# Patient Record
Sex: Female | Born: 1995 | Race: Black or African American | Hispanic: No | Marital: Single | Smoking: Never smoker
Health system: Southern US, Community
[De-identification: ages and names within clinical notes are randomized; demographics above are authoritative.]

## PROBLEM LIST (undated history)

## (undated) DIAGNOSIS — S82892A Other fracture of left lower leg, initial encounter for closed fracture: Secondary | ICD-10-CM

---
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
## 2017-10-25 ENCOUNTER — Other Ambulatory Visit: Payer: Self-pay

## 2017-10-25 ENCOUNTER — Ambulatory Visit (HOSPITAL_COMMUNITY)
Admission: EM | Admit: 2017-10-25 | Discharge: 2017-10-25 | Disposition: A | Discharge status: Home / Self Care | Payer: Medicaid Other | Attending: Family Medicine | Admitting: Family Medicine

## 2017-10-25 ENCOUNTER — Encounter (HOSPITAL_COMMUNITY): Payer: Self-pay

## 2017-10-25 DIAGNOSIS — B9789 Other viral agents as the cause of diseases classified elsewhere: Secondary | ICD-10-CM

## 2017-10-25 DIAGNOSIS — J069 Acute upper respiratory infection, unspecified: Secondary | ICD-10-CM | POA: Diagnosis not present

## 2017-10-25 MED ORDER — METHYLPREDNISOLONE SODIUM SUCC 125 MG IJ SOLR: 80.0000 mg | Freq: Once | INTRAMUSCULAR | Status: DC

## 2017-10-25 MED ORDER — DM-GUAIFENESIN ER 30-600 MG PO TB12: 1.0000 | ORAL_TABLET | Freq: Two times a day (BID) | ORAL | 0 refills | Status: DC

## 2017-10-25 MED ORDER — ALBUTEROL SULFATE HFA 108 (90 BASE) MCG/ACT IN AERS: 1.0000 | INHALATION_SPRAY | Freq: Four times a day (QID) | RESPIRATORY_TRACT | 0 refills | Status: AC | PRN

## 2017-10-25 MED ORDER — METHYLPREDNISOLONE SODIUM SUCC 125 MG IJ SOLR
INTRAMUSCULAR | Status: AC
  Filled 2017-10-25: qty 2

## 2017-10-25 NOTE — ED Provider Notes (Signed)
[facility]    CSN: [id number] Arrival date & time: 10/25/17  1509     History   Chief Complaint Chief Complaint  Patient presents with  . Cough    HPI Jennifer Santiago is a 22 y.o. female.   Patient is a healthy 22 year old female that presents with URI symptoms x4 days.  She has had cough, congestion with mucous.  She has had mild sore throat.  The symptoms have been constant and remain the same.  The cough becomes worse at night.  She has not taken anything for her symptoms.  She denies any ear pain, fever, chills, body aches, night sweats. No recent travels or sick contacts.   She does not smoke. No hx of asthma.   ROS per HPI       Past Medical History:  Diagnosis Date  . Ankle fracture, left 2013    There are no active problems to display for this patient.   History reviewed. No pertinent surgical history.  OB History   None      Home Medications    Prior to Admission medications   Medication Sig Start Date End Date Taking? Authorizing Provider  albuterol (PROVENTIL HFA;VENTOLIN HFA) 108 (90 Base) MCG/ACT inhaler Inhale 1-2 puffs into the lungs every 6 (six) hours as needed for wheezing or shortness of breath. 10/25/17   Quinton Voth A, NP  dextromethorphan-guaiFENesin (MUCINEX DM) 30-600 MG 12hr tablet Take 1 tablet by mouth 2 (two) times daily. 10/25/17   Dahlia ByesBast, Jasiyah Poland A, NP  phenazopyridine (PYRIDIUM) 200 MG tablet Take 1 tablet (200 mg total) by mouth 3 (three) times daily. Patient not taking: Reported on 10/25/2017 01/13/16   Hayden RasmussenMabe, David, NP    Family History History reviewed. No pertinent family history.  Social History Social History   Tobacco Use  . Smoking status: Never Smoker  . Smokeless tobacco: Never Used  Substance Use Topics  . Alcohol use: No  . Drug use: No     Allergies   Patient has no known allergies.   Review of Systems Review of Systems   Physical Exam Triage Vital Signs ED Triage Vitals  Enc Vitals Group       BP 10/25/17 1527 139/82     Pulse Rate 10/25/17 1527 (!) 104     Resp 10/25/17 1527 16     Temp 10/25/17 1527 98.1 F (36.7 C)     Temp Source 10/25/17 1527 Oral     SpO2 10/25/17 1527 100 %     Weight 10/25/17 1529 160 lb (72.6 kg)     Height --      Head Circumference --      Peak Flow --      Pain Score 10/25/17 1529 6     Pain Loc --      Pain Edu? --      Excl. in GC? --    No data found.  Updated Vital Signs BP 139/82   Pulse (!) 104   Temp 98.1 F (36.7 C) (Oral)   Resp 16   Wt 160 lb (72.6 kg)   LMP 10/25/2017   SpO2 100%   BMI 26.63 kg/m   Visual Acuity Right Eye Distance:   Left Eye Distance:   Bilateral Distance:    Right Eye Near:   Left Eye Near:    Bilateral Near:     Physical Exam  Constitutional: She appears well-developed and well-nourished.  HENT:  Head: Normocephalic and atraumatic.  Eyes:  Pupils are equal, round, and reactive to light.  Neck: Normal range of motion.  Cardiovascular: Normal rate.  Mildly tachycardic  Pulmonary/Chest: Effort normal.  Lungs clear.  Coarse cough.  No wheezing, rhonchi.   Musculoskeletal: Normal range of motion.  Neurological: She is alert.  Skin: Skin is warm and dry.  Psychiatric: She has a normal mood and affect.  Nursing note and vitals reviewed.    UC Treatments / Results  Labs (all labs ordered are listed, but only abnormal results are displayed) Labs Reviewed - No data to display  EKG None  Radiology No results found.  Procedures Procedures (including critical care time)  Medications Ordered in UC Medications - No data to display  Initial Impression / Assessment and Plan / UC Course  I have reviewed the triage vital signs and the nursing notes.  Pertinent labs & imaging results that were available during my care of the patient were reviewed by me and considered in my medical decision making (see chart for details).     Viral URI- will treat symptomatically Follow up as needed  for continued or worsening symptoms  Final Clinical Impressions(s) / UC Diagnoses   Final diagnoses:  Viral URI with cough     Discharge Instructions      It was nice meeting you!!  I believe you have a viral upper respiratory infection. It could take 7 to 10 days for the symptoms to decrease or improve.  We will treat your cough and congestion with mucinex DM  Albuterol inhaler as needed for wheezing, or SOB.  Ibuprofen and tylenol can help with the pain.  Chloraseptic throat spray or lozenges are another option for symptoms relief.  Warm tea may help.  Follow up as needed for worsening symptoms.     ED Prescriptions    Medication Sig Dispense Auth. Provider   dextromethorphan-guaiFENesin (MUCINEX DM) 30-600 MG 12hr tablet Take 1 tablet by mouth 2 (two) times daily. 30 tablet Hend Mccarrell A, NP   albuterol (PROVENTIL HFA;VENTOLIN HFA) 108 (90 Base) MCG/ACT inhaler Inhale 1-2 puffs into the lungs every 6 (six) hours as needed for wheezing or shortness of breath. 1 Inhaler Dahlia ByesBast, Jaimarie Rapozo A, NP     Controlled Substance Prescriptions [place] Controlled Substance Registry consulted? Not Applicable   Janace ArisBast, Leyan Branden A, NP 10/25/17 1626

## 2017-10-25 NOTE — Discharge Instructions (Addendum)
°  It was nice meeting you!!  I believe you have a viral upper respiratory infection. It could take 7 to 10 days for the symptoms to decrease or improve.  We will treat your cough and congestion with mucinex DM  Albuterol inhaler as needed for wheezing, or SOB.  Ibuprofen and tylenol can help with the pain.  Chloraseptic throat spray or lozenges are another option for symptoms relief.  Warm tea may help.  Follow up as needed for worsening symptoms.

## 2017-10-25 NOTE — ED Triage Notes (Signed)
Cough and abdominal discomfort x 4days

## 2017-11-21 ENCOUNTER — Encounter (HOSPITAL_COMMUNITY): Payer: Self-pay

## 2017-11-21 ENCOUNTER — Other Ambulatory Visit: Payer: Self-pay

## 2017-11-21 ENCOUNTER — Emergency Department (HOSPITAL_COMMUNITY)
Admission: EM | Admit: 2017-11-21 | Discharge: 2017-11-21 | Disposition: A | Discharge status: Home / Self Care | Payer: Medicaid Other | Attending: Emergency Medicine | Admitting: Emergency Medicine

## 2017-11-21 DIAGNOSIS — R600 Localized edema: Secondary | ICD-10-CM

## 2017-11-21 MED ORDER — PREDNISONE 10 MG PO TABS: 20.0000 mg | ORAL_TABLET | Freq: Every day | ORAL | 0 refills | Status: DC

## 2017-11-21 NOTE — ED Provider Notes (Signed)
[facility]-EMERGENCY DEPT Provider Note   CSN: [id number] Arrival date & time: 11/21/17  1036     History   Chief Complaint Chief Complaint  Patient presents with  . Oral Swelling    HPI Jennifer Santiago is a 22 y.o. female.  22 year old female with prior medical history as documented below presents with complaint of swelling of the upper lip.  Patient reports that she woke up this morning and noticed mild edema of her upper lip.  She denies any other edema or complaint.  She denies difficulty swallowing.  She denies pain to the lip.  She denies swelling of the tongue.  She denies difficulty with phonation.  She took Benadryl at home prior to arrival and her swollen lip has improved significantly.  She denies prior occurrence of similar complaints.  The history is provided by the patient.  Illness  This is a new problem. The current episode started 1 to 2 hours ago. The problem occurs constantly. The problem has been resolved. Pertinent negatives include no chest pain, no abdominal pain, no headaches and no shortness of breath. Nothing aggravates the symptoms. Nothing relieves the symptoms. She has tried nothing for the symptoms.    Past Medical History:  Diagnosis Date  . Ankle fracture, left 2013    There are no active problems to display for this patient.   History reviewed. No pertinent surgical history.   OB History   None      Home Medications    Prior to Admission medications   Medication Sig Start Date End Date Taking? Authorizing Provider  albuterol (PROVENTIL HFA;VENTOLIN HFA) 108 (90 Base) MCG/ACT inhaler Inhale 1-2 puffs into the lungs every 6 (six) hours as needed for wheezing or shortness of breath. 10/25/17  Yes Bast, Traci A, NP  dextromethorphan-guaiFENesin (MUCINEX DM) 30-600 MG 12hr tablet Take 1 tablet by mouth 2 (two) times daily. Patient not taking: Reported on 11/21/2017 10/25/17   Dahlia Byes A, NP  phenazopyridine (PYRIDIUM)  200 MG tablet Take 1 tablet (200 mg total) by mouth 3 (three) times daily. Patient not taking: Reported on 10/25/2017 01/13/16   Hayden Rasmussen, NP  predniSONE (DELTASONE) 10 MG tablet Take 2 tablets (20 mg total) by mouth daily. 11/21/17   Wynetta Fines, MD    Family History Family History  Problem Relation Age of Onset  . Diabetes Mother     Social History Social History   Tobacco Use  . Smoking status: Never Smoker  . Smokeless tobacco: Never Used  Substance Use Topics  . Alcohol use: No  . Drug use: No     Allergies   Patient has no known allergies.   Review of Systems Review of Systems  Respiratory: Negative for shortness of breath.   Cardiovascular: Negative for chest pain.  Gastrointestinal: Negative for abdominal pain.  Neurological: Negative for headaches.  All other systems reviewed and are negative.    Physical Exam Updated Vital Signs BP 140/81 (BP Location: Right Arm)   Pulse (!) 125   Temp 99.2 F (37.3 C) (Oral)   Resp 18   Ht 5\' 6"  (1.676 m)   Wt 68 kg   LMP 10/25/2017   SpO2 97%   BMI 24.21 kg/m   Physical Exam  Constitutional: She is oriented to person, place, and time. She appears well-developed and well-nourished. No distress.  HENT:  Head: Normocephalic and atraumatic.  Mouth/Throat: Oropharynx is clear and moist.  Mild edema noted of upper lip -  no erythema - no other oral edema noted   Eyes: Pupils are equal, round, and reactive to light. Conjunctivae and EOM are normal.  Neck: Normal range of motion. Neck supple.  Cardiovascular: Normal rate, regular rhythm and normal heart sounds.  Pulmonary/Chest: Effort normal and breath sounds normal. No respiratory distress.  Abdominal: Soft. She exhibits no distension. There is no tenderness.  Musculoskeletal: Normal range of motion. She exhibits no edema or deformity.  Neurological: She is alert and oriented to person, place, and time.  Skin: Skin is warm and dry.  No rash   Psychiatric:  She has a normal mood and affect.  Nursing note and vitals reviewed.    ED Treatments / Results  Labs (all labs ordered are listed, but only abnormal results are displayed) Labs Reviewed - No data to display  EKG None  Radiology No results found.  Procedures Procedures (including critical care time)  Medications Ordered in ED Medications - No data to display   Initial Impression / Assessment and Plan / ED Course  I have reviewed the triage vital signs and the nursing notes.  Pertinent labs & imaging results that were available during my care of the patient were reviewed by me and considered in my medical decision making (see chart for details).     MDM  Screen complete  Patient is presenting for evaluation of upper lip edema.  This edema has significantly improved prior to arrival in the ED after she took Benadryl at home.  I suspect that the patient may have at least a minimal allergic reaction.  Given that symptoms have significantly improved I do not feel that she would necessarily requires prednisone at this time.  Patient educated about the treatment for a more significant allergic reaction.  Patient will be given a prescription for prednisone in case her swelling recurs or returns.  Strict return precautions given and understood.  Importance of close follow-up was stressed.   Final Clinical Impressions(s) / ED Diagnoses   Final diagnoses:  Lip edema    ED Discharge Orders         Ordered    predniSONE (DELTASONE) 10 MG tablet  Daily     11/21/17 1219           Wynetta FinesMessick, Dunya Meiners C, MD 11/21/17 1223

## 2017-11-21 NOTE — ED Notes (Signed)
ED Provider at bedside. 

## 2017-11-21 NOTE — Discharge Instructions (Signed)
Please return for any problem. Follow up with a regular physician as instructed.

## 2017-11-21 NOTE — ED Notes (Signed)
Bed: [facility]ALB Expected date:  Expected time:  Means of arrival:  Comments: 

## 2017-11-21 NOTE — ED Triage Notes (Signed)
Patient reports that she began having upper lip swelling since 0830 this AM. patient states she had a [place] cheese steak prior to lip swelling. Patient denies any problems swallowing. patient eating Fritos in triage.

## 2017-11-21 NOTE — ED Notes (Signed)
Bed: [id number] Expected date:  Expected time:  Means of arrival:  Comments: 22 yo m, combative

## 2018-04-17 ENCOUNTER — Emergency Department (HOSPITAL_COMMUNITY)
Admission: EM | Admit: 2018-04-17 | Discharge: 2018-04-17 | Disposition: A | Discharge status: Home / Self Care | Payer: Medicaid Other | Attending: Emergency Medicine | Admitting: Emergency Medicine

## 2018-04-17 ENCOUNTER — Encounter (HOSPITAL_COMMUNITY): Payer: Self-pay

## 2018-04-17 DIAGNOSIS — R0789 Other chest pain: Secondary | ICD-10-CM | POA: Insufficient documentation

## 2018-04-17 DIAGNOSIS — M7918 Myalgia, other site: Secondary | ICD-10-CM

## 2018-04-17 DIAGNOSIS — Z79899 Other long term (current) drug therapy: Secondary | ICD-10-CM | POA: Diagnosis not present

## 2018-04-17 DIAGNOSIS — Y999 Unspecified external cause status: Secondary | ICD-10-CM | POA: Insufficient documentation

## 2018-04-17 DIAGNOSIS — Y92410 Unspecified street and highway as the place of occurrence of the external cause: Secondary | ICD-10-CM | POA: Diagnosis not present

## 2018-04-17 DIAGNOSIS — M549 Dorsalgia, unspecified: Secondary | ICD-10-CM | POA: Diagnosis not present

## 2018-04-17 DIAGNOSIS — M542 Cervicalgia: Secondary | ICD-10-CM | POA: Diagnosis not present

## 2018-04-17 DIAGNOSIS — Y9389 Activity, other specified: Secondary | ICD-10-CM | POA: Diagnosis not present

## 2018-04-17 MED ORDER — NAPROXEN 500 MG PO TABS: 500.0000 mg | ORAL_TABLET | Freq: Two times a day (BID) | ORAL | 0 refills | Status: AC

## 2018-04-17 MED ORDER — IBUPROFEN 800 MG PO TABS
800.0000 mg | ORAL_TABLET | Freq: Once | ORAL | Status: AC
  Administered 2018-04-17: 800 mg via ORAL
  Filled 2018-04-17: qty 1

## 2018-04-17 MED ORDER — CYCLOBENZAPRINE HCL 10 MG PO TABS: 10.0000 mg | ORAL_TABLET | Freq: Two times a day (BID) | ORAL | 0 refills | Status: AC | PRN

## 2018-04-17 NOTE — Discharge Instructions (Addendum)
Take naproxen and Flexeril as needed as directed for muscle soreness.  Do not drive or operate machinery if taking Flexeril.  Recommend warm compresses to sore muscles for 20 minutes at a time.  Gentle range of motion exercises as discussed.  Follow-up with primary care provider, referral given, if needed.
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
## 2018-04-24 ENCOUNTER — Other Ambulatory Visit: Payer: Self-pay

## 2018-04-24 ENCOUNTER — Encounter (HOSPITAL_COMMUNITY): Payer: Self-pay

## 2018-04-24 ENCOUNTER — Emergency Department (HOSPITAL_COMMUNITY): Payer: Medicaid Other

## 2018-04-24 ENCOUNTER — Emergency Department (HOSPITAL_COMMUNITY)
Admission: EM | Admit: 2018-04-24 | Discharge: 2018-04-24 | Disposition: A | Discharge status: Home / Self Care | Payer: Medicaid Other | Attending: Emergency Medicine | Admitting: Emergency Medicine

## 2018-04-24 DIAGNOSIS — J111 Influenza due to unidentified influenza virus with other respiratory manifestations: Secondary | ICD-10-CM | POA: Diagnosis not present

## 2018-04-24 DIAGNOSIS — M545 Low back pain, unspecified: Secondary | ICD-10-CM

## 2018-04-24 DIAGNOSIS — M546 Pain in thoracic spine: Secondary | ICD-10-CM | POA: Insufficient documentation

## 2018-04-24 DIAGNOSIS — R69 Illness, unspecified: Secondary | ICD-10-CM

## 2018-04-24 DIAGNOSIS — M549 Dorsalgia, unspecified: Secondary | ICD-10-CM | POA: Diagnosis present

## 2018-04-24 LAB — PREGNANCY, URINE: Preg Test, Ur: NEGATIVE

## 2018-04-24 MED ORDER — ACETAMINOPHEN 500 MG PO TABS
1000.0000 mg | ORAL_TABLET | Freq: Once | ORAL | Status: AC
  Administered 2018-04-24: 1000 mg via ORAL
  Filled 2018-04-24: qty 2

## 2018-04-24 MED ORDER — LIDOCAINE 5 % EX PTCH
1.0000 | MEDICATED_PATCH | CUTANEOUS | Status: DC
  Administered 2018-04-24: 1 via TRANSDERMAL
  Filled 2018-04-24: qty 1
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
## 2018-04-24 NOTE — Discharge Instructions (Signed)
Your upper respiratory symptoms are likely caused by a viral upper respiratory infection. Antibiotics are not helpful in treating viral infection, the virus should run its course in about 5-7 days. Please make sure you are drinking plenty of fluids. You can treat your symptoms supportively with tylenol/ibuprofen for fevers and pains, Zyrtec and Flonase to help with nasal congestion, and over the counter cough syrups and throat lozenges to help with cough. If your symptoms are not improving please follow up with you Primary doctor.   If you develop persistent fevers, shortness of breath or difficulty breathing, chest pain, severe headache and neck pain, persistent nausea and vomiting or other new or concerning symptoms return to the Emergency department.  Regarding her back pain your x-rays look good today.  Please continue taking muscle relaxer, naproxen, please add Tylenol and over-the-counter muscle rubs or lidocaine patches to help with this, if symptoms are still not improving I would like for you to follow-up with your regular doctor for reevaluation.  Return to the emergency department if you have significantly worsened pain, numbness or weakness in your legs, loss of control of your bowels or bladder or any other new or concerning symptoms.

## 2018-04-24 NOTE — ED Notes (Signed)
Pt was upset because she was informed that she may return to the ED to extend her school note if she needs it.  This nurse apologized for the wrong information provided to her.  But explained to her that her school noted cannot be extended if she is not evaluated by a provided d/t liability reasons.  Pt verbalized understanding.
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
## 2019-11-18 ENCOUNTER — Emergency Department (HOSPITAL_COMMUNITY)
Admission: EM | Admit: 2019-11-18 | Discharge: 2019-11-18 | Disposition: A | Discharge status: Home / Self Care | Payer: Medicaid Other | Attending: Emergency Medicine | Admitting: Emergency Medicine

## 2019-11-18 ENCOUNTER — Other Ambulatory Visit: Payer: Self-pay

## 2019-11-18 ENCOUNTER — Encounter (HOSPITAL_COMMUNITY): Payer: Self-pay | Admitting: Emergency Medicine

## 2019-11-18 ENCOUNTER — Emergency Department (HOSPITAL_COMMUNITY): Payer: Medicaid Other

## 2019-11-18 DIAGNOSIS — R079 Chest pain, unspecified: Secondary | ICD-10-CM | POA: Insufficient documentation

## 2019-11-18 DIAGNOSIS — Z5321 Procedure and treatment not carried out due to patient leaving prior to being seen by health care provider: Secondary | ICD-10-CM | POA: Diagnosis not present

## 2019-11-18 NOTE — ED Notes (Signed)
Pt states that she is here for covid exam after being exposed 72hrs ago.

## 2019-11-18 NOTE — ED Triage Notes (Signed)
Patient here from home reporting "covid symptoms". Not vaccinated. Chest pain, generalized body aches, chills x4 days.

## 2019-11-18 NOTE — ED Notes (Signed)
Pt educated of plan of care associated with CP symptoms; pt states that she doesn't want to sit here for 3+hrs. Pt signed AMA at this time.

## 2020-02-03 ENCOUNTER — Emergency Department (HOSPITAL_COMMUNITY)
Admission: EM | Admit: 2020-02-03 | Discharge: 2020-02-03 | Disposition: A | Discharge status: Home / Self Care | Payer: Medicaid Other | Attending: Emergency Medicine | Admitting: Emergency Medicine

## 2020-02-03 ENCOUNTER — Other Ambulatory Visit: Payer: Self-pay

## 2020-02-03 ENCOUNTER — Encounter (HOSPITAL_COMMUNITY): Payer: Self-pay

## 2020-02-03 DIAGNOSIS — J069 Acute upper respiratory infection, unspecified: Secondary | ICD-10-CM | POA: Insufficient documentation

## 2020-02-03 DIAGNOSIS — R059 Cough, unspecified: Secondary | ICD-10-CM | POA: Diagnosis present

## 2020-02-03 NOTE — ED Provider Notes (Signed)
[facility]-EMERGENCY DEPT Provider Note   CSN: [id number] Arrival date & time: 02/03/20  1307     History Chief Complaint  Patient presents with   Cough   Nasal Congestion    Jennifer Santiago is a 24 y.o. female noncontributory past medical history.  Had COVID vaccinations.  HPI Patient presents to emergency department today with chief complaint of persistent cough and nasal congestion x3 days.  Patient states her cough is nonproductive.  She states her throat is sore but only after she has a coughing fit.  She has been using DayQuil with some symptom improvement.  She denies being in any pain.  She states her father is recently getting over pneumonia, otherwise no sick contacts.  No known Covid exposures.  She denies any fever, chills, otalgia, shortness of breath, chest pain abdominal pain, nausea, vomiting, urinary symptoms, diarrhea.    Past Medical History:  Diagnosis Date   Ankle fracture, left 2013    There are no problems to display for this patient.   History reviewed. No pertinent surgical history.   OB History   No obstetric history on file.     Family History  Problem Relation Age of Onset   Diabetes Mother     Social History   Tobacco Use   Smoking status: Never Smoker   Smokeless tobacco: Never Used  Vaping Use   Vaping Use: Never used  Substance Use Topics   Alcohol use: No   Drug use: No    Home Medications Prior to Admission medications   Medication Sig Start Date End Date Taking? Authorizing Provider  albuterol (PROVENTIL HFA;VENTOLIN HFA) 108 (90 Base) MCG/ACT inhaler Inhale 1-2 puffs into the lungs every 6 (six) hours as needed for wheezing or shortness of breath. 10/25/17   Bast, Traci A, NP  cyclobenzaprine (FLEXERIL) 10 MG tablet Take 1 tablet (10 mg total) by mouth 2 (two) times daily as needed for muscle spasms. 04/17/18   Jeannie Fend, PA-C  naproxen (NAPROSYN) 500 MG tablet Take 1 tablet (500 mg total)  by mouth 2 (two) times daily. 04/17/18   Jeannie Fend, PA-C  predniSONE (DELTASONE) 10 MG tablet Take 2 tablets (20 mg total) by mouth daily. 11/21/17   Wynetta Fines, MD    Allergies    Patient has no known allergies.  Review of Systems   Review of Systems All other systems are reviewed and are negative for acute change except as noted in the HPI.  Physical Exam Updated Vital Signs BP 120/78 (BP Location: Right Arm)    Pulse 66    Temp 98.4 F (36.9 C) (Oral)    Resp 16    LMP 01/18/2020 (Approximate)    SpO2 98%   Physical Exam Vitals and nursing note reviewed.  Constitutional:      Appearance: She is well-developed. She is not ill-appearing or toxic-appearing.  HENT:     Head: Normocephalic and atraumatic.     Jaw: There is normal jaw occlusion.     Right Ear: Tympanic membrane and external ear normal.     Left Ear: Tympanic membrane and external ear normal.     Nose: Congestion present.     Right Sinus: No maxillary sinus tenderness or frontal sinus tenderness.     Left Sinus: No maxillary sinus tenderness or frontal sinus tenderness.  Eyes:     General: No scleral icterus.       Right eye: No discharge.  Left eye: No discharge.     Conjunctiva/sclera: Conjunctivae normal.     Right eye: Right conjunctiva is not injected.     Left eye: Left conjunctiva is not injected.  Neck:     Vascular: No JVD.  Cardiovascular:     Rate and Rhythm: Normal rate and regular rhythm.     Pulses: Normal pulses.     Heart sounds: Normal heart sounds.  Pulmonary:     Effort: Pulmonary effort is normal. No respiratory distress.     Breath sounds: Normal breath sounds. No stridor. No wheezing, rhonchi or rales.  Chest:     Chest wall: No tenderness.  Abdominal:     General: There is no distension.  Musculoskeletal:        General: Normal range of motion.     Cervical back: Normal range of motion.  Lymphadenopathy:     Cervical: No cervical adenopathy.  Skin:    General:  Skin is warm and dry.     Capillary Refill: Capillary refill takes less than 2 seconds.     Findings: No rash.  Neurological:     Mental Status: She is oriented to person, place, and time.     GCS: GCS eye subscore is 4. GCS verbal subscore is 5. GCS motor subscore is 6.     Comments: Fluent speech, no facial droop.  Psychiatric:        Behavior: Behavior normal.     ED Results / Procedures / Treatments   Labs (all labs ordered are listed, but only abnormal results are displayed) Labs Reviewed - No data to display  EKG None  Radiology No results found.  Procedures Procedures (including critical care time)  Medications Ordered in ED Medications - No data to display  ED Course  I have reviewed the triage vital signs and the nursing notes.  Pertinent labs & imaging results that were available during my care of the patient were reviewed by me and considered in my medical decision making (see chart for details).    MDM Rules/Calculators/A&P                          History provided by patient with additional history obtained from chart review.    Patient presents with URI type symptoms.  Patient is nontoxic appearing, in no apparent distress, vitals are WNL. Patient is afebrile in the ED, lungs are CTA. Doubt pneumonia and engaged in shared decision making with patient, she wishes not to have a chest x-ray.  I feel this is reasonable given reassuring exam.. There is no wheezing or signs of respiratory distress. Sxs onset < 7 days, afebrile, no sinus tenderness, doubt acute bacterial sinusitis. Centor score 0, doubt strep pharyngitis. No evidence of AOM on exam. No meningeal signs. No history components or rashes to raise concern for tic borne illness. Suspect viral vs allergic etiology at this time and recommend symptomatic care.  Discussed results, treatment plan, need for PCP follow-up, and return precautions with the patient. Provided opportunity for questions, patient confirmed  understanding and is in agreement with plan.    Portions of this note were generated with Scientist, clinical (histocompatibility and immunogenetics). Dictation errors may occur despite best attempts at proofreading.   Final Clinical Impression(s) / ED Diagnoses Final diagnoses:  Viral URI with cough    Rx / DC Orders ED Discharge Orders    None       Shanon Ace, PA-C 02/03/20 1541  Terald Sleeper, MD 02/04/20 1248

## 2020-02-03 NOTE — ED Triage Notes (Signed)
Pt presents with c/o cough and nasal congestion since Thursday. Pt has had both covid-19 vaccines, denies any known sick contacts.

## 2020-02-03 NOTE — Discharge Instructions (Signed)
It is likely you have a viral illness.  Continue the over-the-counter medications you have been doing.  You can try adding Mucinex for cough medicine.  Be sure you drink plenty of fluids and stay well-hydrated.  Return to the emergency department for any new or worsening symptoms.  Thank you  for allowing [place] to care for you today.

## 2021-04-06 ENCOUNTER — Other Ambulatory Visit: Payer: Self-pay | Admitting: Orthopaedic Surgery

## 2021-04-06 DIAGNOSIS — M79672 Pain in left foot: Secondary | ICD-10-CM

## 2021-05-15 ENCOUNTER — Other Ambulatory Visit: Payer: Medicaid Other

## 2021-05-15 ENCOUNTER — Ambulatory Visit
Admission: RE | Admit: 2021-05-15 | Discharge: 2021-05-15 | Disposition: A | Discharge status: Home / Self Care | Payer: Medicaid Other | Source: Ambulatory Visit | Attending: Orthopaedic Surgery | Admitting: Orthopaedic Surgery

## 2021-05-15 ENCOUNTER — Other Ambulatory Visit: Payer: Self-pay

## 2021-05-15 DIAGNOSIS — M79672 Pain in left foot: Secondary | ICD-10-CM

## 2023-02-14 ENCOUNTER — Emergency Department (HOSPITAL_COMMUNITY)
Admission: EM | Admit: 2023-02-14 | Discharge: 2023-02-14 | Disposition: A | Discharge status: Home / Self Care | Payer: Medicaid Other | Attending: Emergency Medicine | Admitting: Emergency Medicine

## 2023-02-14 ENCOUNTER — Encounter (HOSPITAL_COMMUNITY): Payer: Self-pay

## 2023-02-14 ENCOUNTER — Other Ambulatory Visit: Payer: Self-pay

## 2023-02-14 DIAGNOSIS — L299 Pruritus, unspecified: Secondary | ICD-10-CM | POA: Insufficient documentation

## 2023-02-14 DIAGNOSIS — T7840XA Allergy, unspecified, initial encounter: Secondary | ICD-10-CM

## 2023-02-14 DIAGNOSIS — T39015A Adverse effect of aspirin, initial encounter: Secondary | ICD-10-CM | POA: Diagnosis not present

## 2023-02-14 MED ORDER — METHYLPREDNISOLONE SODIUM SUCC 125 MG IJ SOLR
125.0000 mg | Freq: Once | INTRAMUSCULAR | Status: AC
  Administered 2023-02-14: 125 mg via INTRAVENOUS
  Filled 2023-02-14: qty 2

## 2023-02-14 MED ORDER — FAMOTIDINE 20 MG PO TABS: 20.0000 mg | ORAL_TABLET | Freq: Two times a day (BID) | ORAL | 0 refills | Status: AC

## 2023-02-14 MED ORDER — FAMOTIDINE IN NACL 20-0.9 MG/50ML-% IV SOLN
20.0000 mg | Freq: Once | INTRAVENOUS | Status: AC
  Administered 2023-02-14: 20 mg via INTRAVENOUS
  Filled 2023-02-14: qty 50

## 2023-02-14 MED ORDER — PREDNISONE 20 MG PO TABS: 40.0000 mg | ORAL_TABLET | Freq: Every day | ORAL | 0 refills | Status: AC

## 2023-02-14 NOTE — Discharge Instructions (Signed)
Please follow-up with the allergist.  Take medications as directed.  Return if symptoms worsen.

## 2023-02-14 NOTE — ED Provider Notes (Signed)
[facility]-EMERGENCY DEPT [facility] Emergency Department Provider Note MRN:  [id number]  Arrival date & time: 02/14/23     Chief Complaint   Allergic Reaction   History of Present Illness   Jennifer Santiago is a 27 y.o. year-old female presents to the ED with chief complaint of allergic reaction.  She states that she took an aspirin tonight and shortly thereafter developed swelling of the face and arms.  She reports mild itching.  She denies any SOB, chest pain.  History provided by patient.   Review of Systems  Pertinent positive and negative review of systems noted in HPI.    Physical Exam   Vitals:   02/14/23 0148  BP: (!) 143/101  Pulse: 97  Resp: 18  Temp: 98.2 F (36.8 C)  SpO2: 99%    CONSTITUTIONAL:  non toxic-appearing, NAD NEURO:  Alert and oriented x 3, CN 3-12 grossly intact EYES:  eyes equal and reactive ENT/NECK:  Supple, no stridor, oropharynx is clear, some mild swelling of the cheeks and face CARDIO:  normal rate, regular rhythm, appears well-perfused  PULM:  No respiratory distress, CTAB GI/GU:  non-distended,  MSK/SPINE:  No gross deformities, no edema, moves all extremities  SKIN:  no obvious rash   *Additional and/or pertinent findings included in MDM below  Diagnostic and Interventional Summary    EKG Interpretation Date/Time:    Ventricular Rate:    PR Interval:    QRS Duration:    QT Interval:    QTC Calculation:   R Axis:      Text Interpretation:         Labs Reviewed - No data to display  No orders to display    Medications  methylPREDNISolone sodium succinate (SOLU-MEDROL) 125 mg/2 mL injection 125 mg (125 mg Intravenous Given 02/14/23 0320)  famotidine (PEPCID) IVPB 20 mg premix (0 mg Intravenous Stopped 02/14/23 0437)     Procedures  /  Critical Care Procedures  ED Course and Medical Decision Making  I have reviewed the triage vital signs, the nursing notes, and pertinent available records from the EMR.  Social  Determinants Affecting Complexity of Care: Patient has no clinically significant social determinants affecting this chief complaint..   ED Course:    Medical Decision Making Patient here with allergic reaction.  She had some swelling and rash on her face and arms.  She states that this occurred after taking an aspirin.  She denies having had a reaction like this before.  She also reports that she has had some new detergent.  I have advised her to discontinue the detergent.  She is given prednisone and Pepcid and encouraged to continue Benadryl at home.  She feels better after treatment with Solu-Medrol, and Pepcid in the ED.  Recommend allergist follow-up.  Risk Prescription drug management.         Consultants: No consultations were needed in caring for this patient.   Treatment and Plan: Emergency department workup does not suggest an emergent condition requiring admission or immediate intervention beyond  what has been performed at this time. The patient is safe for discharge and has  been instructed to return immediately for worsening symptoms, change in  symptoms or any other concerns    Final Clinical Impressions(s) / ED Diagnoses     ICD-10-CM   1. Allergic reaction, initial encounter  T78.40XA       ED Discharge Orders          Ordered    predniSONE (DELTASONE)  20 MG tablet  Daily        02/14/23 0419    famotidine (PEPCID) 20 MG tablet  2 times daily        02/14/23 0419              Discharge Instructions Discussed with and Provided to Patient:     Discharge Instructions      Please follow-up with the allergist.  Take medications as directed.  Return if symptoms worsen.       Roxy Horseman, PA-C 02/14/23 0440    Palumbo, April, MD 02/14/23 [phone]

## 2023-02-14 NOTE — ED Triage Notes (Signed)
Pt. Arrives POV with friend c/o an allergic reaction. Pt. Has a rash on her face, neck, arms, and legs. Denies SOB and throat discomfort. Pt. Took 2 benadryl about 30 mins ago. States that she doesn't know if it were from a new detergent or from aspirin that she took while she was at work.
